# Patient Record
Sex: Male | Born: 1965 | Race: White | Hispanic: No | Marital: Single | Smoking: Current every day smoker
Health system: Southern US, Community
[De-identification: ages and names within clinical notes are randomized; demographics above are authoritative.]

## PROBLEM LIST (undated history)

## (undated) DIAGNOSIS — I10 Essential (primary) hypertension: Secondary | ICD-10-CM

## (undated) HISTORY — PX: CHOLECYSTECTOMY: SHX55

---

## 2016-06-11 ENCOUNTER — Encounter (HOSPITAL_COMMUNITY): Payer: Self-pay

## 2016-06-11 ENCOUNTER — Emergency Department (HOSPITAL_COMMUNITY): Payer: Self-pay

## 2016-06-11 ENCOUNTER — Emergency Department (HOSPITAL_COMMUNITY)
Admission: EM | Admit: 2016-06-11 | Discharge: 2016-06-11 | Disposition: A | Discharge status: Home / Self Care | Payer: Self-pay | Attending: Emergency Medicine | Admitting: Emergency Medicine

## 2016-06-11 DIAGNOSIS — Y939 Activity, unspecified: Secondary | ICD-10-CM | POA: Insufficient documentation

## 2016-06-11 DIAGNOSIS — Y990 Civilian activity done for income or pay: Secondary | ICD-10-CM | POA: Insufficient documentation

## 2016-06-11 DIAGNOSIS — I10 Essential (primary) hypertension: Secondary | ICD-10-CM | POA: Insufficient documentation

## 2016-06-11 DIAGNOSIS — F17210 Nicotine dependence, cigarettes, uncomplicated: Secondary | ICD-10-CM | POA: Insufficient documentation

## 2016-06-11 DIAGNOSIS — W208XXA Other cause of strike by thrown, projected or falling object, initial encounter: Secondary | ICD-10-CM | POA: Insufficient documentation

## 2016-06-11 DIAGNOSIS — Y929 Unspecified place or not applicable: Secondary | ICD-10-CM | POA: Insufficient documentation

## 2016-06-11 DIAGNOSIS — S9031XA Contusion of right foot, initial encounter: Secondary | ICD-10-CM | POA: Insufficient documentation

## 2016-06-11 HISTORY — DX: Essential (primary) hypertension: I10

## 2016-06-11 LAB — CBC WITH DIFFERENTIAL/PLATELET
BASOS ABS: 0 10*3/uL (ref 0.0–0.1)
Basophils Relative: 0 %
Eosinophils Absolute: 0.1 10*3/uL (ref 0.0–0.7)
Eosinophils Relative: 1 %
HEMATOCRIT: 47.4 % (ref 39.0–52.0)
HEMOGLOBIN: 15.8 g/dL (ref 13.0–17.0)
LYMPHS ABS: 1.8 10*3/uL (ref 0.7–4.0)
LYMPHS PCT: 27 %
MCH: 31.5 pg (ref 26.0–34.0)
MCHC: 33.3 g/dL (ref 30.0–36.0)
MCV: 94.6 fL (ref 78.0–100.0)
Monocytes Absolute: 0.9 10*3/uL (ref 0.1–1.0)
Monocytes Relative: 14 %
NEUTROS ABS: 4.1 10*3/uL (ref 1.7–7.7)
NEUTROS PCT: 58 %
PLATELETS: 229 10*3/uL (ref 150–400)
RBC: 5.01 MIL/uL (ref 4.22–5.81)
RDW: 13.1 % (ref 11.5–15.5)
WBC: 6.9 10*3/uL (ref 4.0–10.5)

## 2016-06-11 LAB — BASIC METABOLIC PANEL
ANION GAP: 7 (ref 5–15)
BUN: 15 mg/dL (ref 6–20)
CHLORIDE: 110 mmol/L (ref 101–111)
CO2: 27 mmol/L (ref 22–32)
Calcium: 9.5 mg/dL (ref 8.9–10.3)
Creatinine, Ser: 1.21 mg/dL (ref 0.61–1.24)
GFR calc Af Amer: >60 mL/min (ref >60)
GLUCOSE: 89 mg/dL (ref 65–99)
POTASSIUM: 3.5 mmol/L (ref 3.5–5.1)
Sodium: 144 mmol/L (ref 135–145)

## 2016-06-11 MED ORDER — HYDROCHLOROTHIAZIDE 25 MG PO TABS
25.0000 mg | ORAL_TABLET | Freq: Every day | ORAL | Status: DC
  Administered 2016-06-11: 25 mg via ORAL
  Filled 2016-06-11: qty 1

## 2016-06-11 MED ORDER — LISINOPRIL 10 MG PO TABS: 10.0000 mg | ORAL_TABLET | Freq: Every day | ORAL | 0 refills | Status: AC

## 2016-06-11 MED ORDER — LISINOPRIL 10 MG PO TABS
10.0000 mg | ORAL_TABLET | Freq: Once | ORAL | Status: AC
  Administered 2016-06-11: 10 mg via ORAL
  Filled 2016-06-11: qty 1

## 2016-06-11 MED ORDER — OXYCODONE-ACETAMINOPHEN 5-325 MG PO TABS
ORAL_TABLET | ORAL | Status: AC
  Filled 2016-06-11: qty 1

## 2016-06-11 MED ORDER — OXYCODONE-ACETAMINOPHEN 5-325 MG PO TABS
1.0000 | ORAL_TABLET | ORAL | Status: DC | PRN
Start: 2016-06-11 — End: 2016-06-11
  Administered 2016-06-11: 1 via ORAL

## 2016-06-11 MED ORDER — HYDROCHLOROTHIAZIDE 25 MG PO TABS: 25.0000 mg | ORAL_TABLET | Freq: Every day | ORAL | 0 refills | Status: AC

## 2016-06-11 MED ORDER — HYDROCODONE-ACETAMINOPHEN 5-325 MG PO TABS: 1.0000 | ORAL_TABLET | Freq: Four times a day (QID) | ORAL | 0 refills | Status: AC | PRN

## 2016-06-11 MED ORDER — HYDROMORPHONE HCL 1 MG/ML IJ SOLN
1.0000 mg | Freq: Once | INTRAMUSCULAR | Status: AC
  Administered 2016-06-11: 1 mg via INTRAVENOUS
  Filled 2016-06-11: qty 1

## 2016-06-11 NOTE — Discharge Instructions (Signed)
Take motrin for pain.   Take vicodin for severe pain. DO NOT drive with it.   Use crutches and postop shoe for next few days. Expect some foot swelling and pain. Apply ice on it.   See [facility] to recheck blood pressure in a week.   Take your blood pressure meds in a week. If you can't get an appointment, please check blood pressure in a pharmacy to make sure that it is better.   Return to ER if you have severe foot pain and swelling, headaches, vomiting, chest pain.

## 2016-06-11 NOTE — Progress Notes (Signed)
Orthopedic Tech Progress Note Patient Details:  Martin Burke June 09, 1966 [id number]  Ortho Devices Type of Ortho Device: Crutches, Postop shoe/boot Ortho Device/Splint Location: RLE Ortho Device/Splint Interventions: Ordered, Application   Jennye MoccasinHughes, Yarethzy Croak Craig 06/11/2016, 3:37 PM

## 2016-06-11 NOTE — ED Provider Notes (Signed)
[facility]EMERGENCY DEPT Provider Note   CSN: [id number] Arrival date & time: 06/11/16  1209     History   Chief Complaint Chief Complaint  Patient presents with  . Foot Injury    HPI Evern CoreKeith Goon is a 50 y.o. male hx of HTN with medication uncompliance here with foot injury. States that he was at his job and a heavy beam landed on his right foot. Has severe throbbing right foot pain afterwards. Denies any head injury. Denies any chest or abdominal pain. He was noted to be hypertensive in triage and has hx of HTN but uncompliant with meds for several months.   The history is provided by the patient.    Past Medical History:  Diagnosis Date  . Hypertension     There are no active problems to display for this patient.   Past Surgical History:  Procedure Laterality Date  . CHOLECYSTECTOMY         Home Medications    Prior to Admission medications   Not on File    Family History No family history on file.  Social History Social History  Substance Use Topics  . Smoking status: Current Every Day Smoker    Packs/day: 1.00    Types: Cigarettes  . Smokeless tobacco: Never Used  . Alcohol use Yes     Comment: occasionally      Allergies   Review of patient's allergies indicates no known allergies.   Review of Systems Review of Systems  Musculoskeletal:       R foot pain   All other systems reviewed and are negative.    Physical Exam Updated Vital Signs BP (!) 205/123 (BP Location: Left Arm)   Pulse (!) 57   Temp 97.5 F (36.4 C) (Oral)   Resp 13   Ht 5\' 9"  (1.753 m)   Wt 173 lb (78.5 kg)   SpO2 99%   BMI 25.55 kg/m   Physical Exam  Constitutional: He is oriented to person, place, and time.  Uncomfortable   HENT:  Head: Normocephalic.  Eyes: EOM are normal. Pupils are equal, round, and reactive to light.  Neck: Normal range of motion.  Cardiovascular: Normal rate, regular rhythm and normal heart sounds.   Pulmonary/Chest: Effort normal and  breath sounds normal.  Abdominal: Soft. Bowel sounds are normal.  Musculoskeletal:  R foot slightly swollen, 2+ dorsalis pedis pulse. Nl sensation. Able to wiggle toes with pain. No obvious deformity.   Neurological: He is alert and oriented to person, place, and time.  Skin: Skin is warm.  Psychiatric: He has a normal mood and affect.  Nursing note and vitals reviewed.    ED Treatments / Results  Labs (all labs ordered are listed, but only abnormal results are displayed) Labs Reviewed  CBC WITH DIFFERENTIAL/PLATELET  BASIC METABOLIC PANEL    EKG  EKG Interpretation None       Radiology Dg Foot Complete Left  Result Date: 06/11/2016 CLINICAL DATA:  Foot pain after object falling on foot. Initial encounter. EXAM: LEFT FOOT - COMPLETE 3+ VIEW COMPARISON:  None. FINDINGS: The mineralization and alignment are normal. There is no evidence of acute fracture or dislocation. The joint spaces are maintained aside from minimal degenerative changes at the first MTP joint. Type 1 accessory navicular and calcaneal spurring noted. No focal soft tissue swelling identified. IMPRESSION: No acute osseous findings. Electronically Signed   By: Carey BullocksWilliam  Veazey M.D.   On: 06/11/2016 13:29    Procedures Procedures (including critical care  time)  Medications Ordered in ED Medications  oxyCODONE-acetaminophen (PERCOCET/ROXICET) 5-325 MG per tablet 1 tablet (1 tablet Oral Given 06/11/16 1225)  hydrochlorothiazide (HYDRODIURIL) tablet 25 mg (25 mg Oral Given 06/11/16 1523)  HYDROmorphone (DILAUDID) injection 1 mg (1 mg Intravenous Given 06/11/16 1530)  lisinopril (PRINIVIL,ZESTRIL) tablet 10 mg (10 mg Oral Given 06/11/16 1523)     Initial Impression / Assessment and Plan / ED Course  I have reviewed the triage vital signs and the nursing notes.  Pertinent labs & imaging results that were available during my care of the patient were reviewed by me and considered in my medical decision making (see  chart for details).  Clinical Course    Evern CoreKeith Wool is a 50 y.o. male here with R foot injury. Hypertensive 230/120 but uncompliant with meds and asymptomatic. Will get foot xray, will get basic labs. Will give pain meds and BP meds and reassess.   5:02 PM Labs unremarkable. BP improved to 205/123 from 221/118. Still asymptomatic. xrays showed no fracture. Given crutches, postop shoe, pain meds. I reviewed [place] controlled substance database and he filled 10 tramadol on 01/13/16. Will give 10 vicodins for pain.    Final Clinical Impressions(s) / ED Diagnoses   Final diagnoses:  None    New Prescriptions New Prescriptions   No medications on file     Charlynne Panderavid Hsienta Bridey Brookover, MD 06/11/16 1704

## 2016-06-11 NOTE — ED Triage Notes (Signed)
Per pT, Pt is coming from work where he dropped a metal beam on his right foot. Pulses noted to be intact with good cap refill. Reports extreme pain and inability to walk due to pain.

## 2016-08-13 ENCOUNTER — Observation Stay (HOSPITAL_COMMUNITY): Payer: Self-pay | Admitting: Internal Medicine

## 2016-08-13 ENCOUNTER — Emergency Department (HOSPITAL_COMMUNITY): Payer: Self-pay

## 2016-08-13 ENCOUNTER — Emergency Department (HOSPITAL_COMMUNITY)
Admission: RE | Admit: 2016-08-13 | Discharge: 2016-08-13 | Disposition: A | Discharge status: Home / Self Care | Payer: Self-pay | Source: Ambulatory Visit

## 2016-08-13 ENCOUNTER — Encounter (HOSPITAL_COMMUNITY): Payer: Self-pay

## 2016-08-13 ENCOUNTER — Observation Stay
Admission: EM | Admit: 2016-08-13 | Discharge: 2016-08-14 | Disposition: A | Discharge status: Home / Self Care | Payer: Self-pay | Attending: Internal Medicine | Admitting: Internal Medicine

## 2016-08-13 ENCOUNTER — Emergency Department
Admission: EM | Admit: 2016-08-13 | Discharge: 2016-08-13 | Disposition: A | Discharge status: Home / Self Care | Payer: Self-pay | Attending: Emergency Medicine | Admitting: Emergency Medicine

## 2016-08-13 DIAGNOSIS — I517 Cardiomegaly: Secondary | ICD-10-CM

## 2016-08-13 DIAGNOSIS — R748 Abnormal levels of other serum enzymes: Secondary | ICD-10-CM

## 2016-08-13 DIAGNOSIS — R7989 Other specified abnormal findings of blood chemistry: Secondary | ICD-10-CM

## 2016-08-13 DIAGNOSIS — G8911 Acute pain due to trauma: Secondary | ICD-10-CM

## 2016-08-13 DIAGNOSIS — I160 Hypertensive urgency: Secondary | ICD-10-CM

## 2016-08-13 DIAGNOSIS — R9431 Abnormal electrocardiogram [ECG] [EKG]: Secondary | ICD-10-CM

## 2016-08-13 DIAGNOSIS — F17200 Nicotine dependence, unspecified, uncomplicated: Secondary | ICD-10-CM

## 2016-08-13 DIAGNOSIS — I161 Hypertensive emergency: Secondary | ICD-10-CM | POA: Diagnosis present

## 2016-08-13 DIAGNOSIS — R109 Unspecified abdominal pain: Secondary | ICD-10-CM | POA: Insufficient documentation

## 2016-08-13 DIAGNOSIS — S0990XA Unspecified injury of head, initial encounter: Secondary | ICD-10-CM | POA: Insufficient documentation

## 2016-08-13 DIAGNOSIS — R51 Headache: Secondary | ICD-10-CM

## 2016-08-13 HISTORY — DX: Essential (primary) hypertension: I10

## 2016-08-13 LAB — BASIC METABOLIC PANEL
ANION GAP: 6 mmol/L
ANION GAP: 8 mmol/L
BUN/CREA RATIO: 13
BUN/CREA RATIO: 12
BUN/CREA RATIO: 12
BUN: 15 mg/dL (ref 8–20)
BUN: 15 mg/dL (ref 8–20)
CALCIUM: 9.7 mg/dL (ref 8.9–10.3)
CALCIUM: 9.9 mg/dL (ref 8.9–10.3)
CHLORIDE: 101 mmol/L (ref 101–111)
CHLORIDE: 97 mmol/L — ABNORMAL LOW (ref 101–111)
CO2 TOTAL: 30 mmol/L (ref 22–32)
CO2 TOTAL: 30 mmol/L (ref 22–32)
CREATININE: 1.2 mg/dL (ref 0.6–1.2)
CREATININE: 1.24 mg/dL — ABNORMAL HIGH (ref 0.6–1.2)
ESTIMATED GFR: 64 mL/min/1.73mˆ2
ESTIMATED GFR: 61 mL/min/1.73mˆ2
GLUCOSE: 102 mg/dL (ref 70–110)
GLUCOSE: 86 mg/dL (ref 70–110)
POTASSIUM: 4.1 mmol/L (ref 3.6–5.1)
POTASSIUM: 3.7 mmol/L (ref 3.6–5.1)
POTASSIUM: 3.7 mmol/L (ref 3.6–5.1)
SODIUM: 137 mmol/L (ref 136–144)
SODIUM: 135 mmol/L — ABNORMAL LOW (ref 136–144)

## 2016-08-13 LAB — CBC WITH DIFF
BASOPHIL #: 0.1 x10ˆ3/uL (ref 0.00–0.20)
BASOPHIL #: 0.1 x10ˆ3/uL (ref 0.00–0.20)
BASOPHIL %: 1 %
BASOPHIL %: 1 %
EOSINOPHIL #: 0.2 x10ˆ3/uL (ref 0.00–0.50)
EOSINOPHIL #: 0.4 x10ˆ3/uL (ref 0.00–0.50)
EOSINOPHIL %: 2 %
EOSINOPHIL %: 3 %
HCT: 49.3 % (ref 38.9–50.5)
HCT: 48.8 % (ref 38.9–50.5)
HGB: 16.9 g/dL (ref 13.4–17.3)
HGB: 17.2 g/dL (ref 13.4–17.3)
LYMPHOCYTE #: 2.5 x10ˆ3/uL (ref 0.80–3.20)
LYMPHOCYTE #: 1.9 x10ˆ3/uL (ref 0.80–3.20)
LYMPHOCYTE %: 23 %
LYMPHOCYTE %: 16 %
MCH: 31.5 pg (ref 27.9–33.1)
MCH: 32.3 pg (ref 27.9–33.1)
MCHC: 34.3 g/dL (ref 32.8–36.0)
MCHC: 35.2 g/dL (ref 32.8–36.0)
MCV: 91.9 fL (ref 82.4–95.0)
MCV: 91.9 fL (ref 82.4–95.0)
MONOCYTE #: 1.1 x10ˆ3/uL — ABNORMAL HIGH (ref 0.20–0.80)
MONOCYTE #: 1.1 x10ˆ3/uL — ABNORMAL HIGH (ref 0.20–0.80)
MONOCYTE %: 10 %
MONOCYTE %: 10 %
MPV: 8.3 fL (ref 6.0–10.2)
MPV: 8.3 fL (ref 6.0–10.2)
NEUTROPHIL #: 6.9 x10ˆ3/uL — ABNORMAL HIGH (ref 1.60–5.50)
NEUTROPHIL #: 7.9 x10ˆ3/uL — ABNORMAL HIGH (ref 1.60–5.50)
NEUTROPHIL %: 64 %
NEUTROPHIL %: 69 %
PLATELETS: 216 x10ˆ3/uL (ref 140–440)
PLATELETS: 205 x10ˆ3/uL (ref 140–440)
RBC: 5.36 x10ˆ6/uL (ref 4.40–5.68)
RBC: 5.32 x10ˆ6/uL (ref 4.40–5.68)
RDW: 13.3 % (ref 10.9–15.1)
RDW: 13.3 % (ref 10.9–15.1)
WBC: 10.9 x10ˆ3/uL — ABNORMAL HIGH (ref 3.3–9.3)
WBC: 11.3 x10ˆ3/uL — ABNORMAL HIGH (ref 3.3–9.3)

## 2016-08-13 LAB — HEPATIC FUNCTION PANEL
ALBUMIN: 4.4 g/dL (ref 3.5–4.8)
ALBUMIN: 4.7 g/dL (ref 3.5–4.8)
ALKALINE PHOSPHATASE: 79 U/L (ref 20–130)
ALKALINE PHOSPHATASE: 94 U/L (ref 20–130)
ALT (SGPT): 38 U/L (ref 7–42)
ALT (SGPT): 48 U/L — ABNORMAL HIGH (ref 7–42)
AST (SGOT): 45 U/L — ABNORMAL HIGH (ref 12–35)
BILIRUBIN DIRECT: <0.2 mg/dL (ref 0.1–0.5)
BILIRUBIN DIRECT: 0.2 mg/dL (ref 0.1–0.5)
BILIRUBIN TOTAL: 0.7 mg/dL (ref 0.3–1.2)
PROTEIN TOTAL: 7.2 g/dL (ref 6.4–8.3)
PROTEIN TOTAL: 8 g/dL (ref 6.4–8.3)

## 2016-08-13 LAB — THYROID STIMULATING HORMONE (SENSITIVE TSH): TSH: 2.154 u[IU]/mL (ref 0.340–5.600)

## 2016-08-13 LAB — URINALYSIS, MACRO/MICRO
BILIRUBIN: NEGATIVE mg/dL
GLUCOSE: NEGATIVE mg/dL
KETONES: NEGATIVE mg/dL
LEUKOCYTES: NEGATIVE WBCs/uL
NITRITE: NEGATIVE
PH: 5 (ref 5.0–7.0)
PROTEIN: 100 mg/dL — AB
SPECIFIC GRAVITY: 1.022 (ref 1.010–1.025)
UROBILINOGEN: 0.2 mg/dL

## 2016-08-13 LAB — TROPONIN-I: TROPONIN I: 0.06 ng/mL (ref <=0.04)

## 2016-08-13 LAB — PT/INR: INR: 0.93 (ref 0.90–1.10)

## 2016-08-13 LAB — LIPASE: LIPASE: 72 U/L — ABNORMAL HIGH (ref 22–51)

## 2016-08-13 MED ORDER — ONDANSETRON HCL (PF) 4 MG/2 ML INJECTION SOLUTION
4.00 mg | INTRAMUSCULAR | Status: AC
Start: 2016-08-13 — End: 2016-08-13
  Administered 2016-08-13: 4 mg via INTRAVENOUS
  Filled 2016-08-13: qty 2

## 2016-08-13 MED ORDER — MORPHINE 4 MG/ML INTRAVENOUS CARTRIDGE
4.00 mg | CARTRIDGE | INTRAVENOUS | Status: AC
Start: 2016-08-13 — End: 2016-08-13
  Administered 2016-08-13: 4 mg via INTRAVENOUS
  Filled 2016-08-13: qty 1

## 2016-08-13 MED ORDER — IOVERSOL 320 MG IODINE/ML INTRAVENOUS SOLUTION
70.00 mL | INTRAVENOUS | Status: AC
Start: 2016-08-13 — End: 2016-08-13
  Administered 2016-08-13: 70 mL via INTRAVENOUS

## 2016-08-13 MED ORDER — SODIUM CHLORIDE 0.9 % INTRAVENOUS SOLUTION
INTRAVENOUS | Status: DC
Start: 2016-08-14 — End: 2016-08-14

## 2016-08-13 MED ORDER — NICOTINE 21 MG/24 HR DAILY TRANSDERMAL PATCH
21.0000 mg | MEDICATED_PATCH | Freq: Every day | TRANSDERMAL | Status: DC
Start: 2016-08-14 — End: 2016-08-14
  Administered 2016-08-14: 0 mg via TRANSDERMAL
  Filled 2016-08-13 (×2): qty 1

## 2016-08-13 MED ORDER — HYDRALAZINE 20 MG/ML INJECTION SOLUTION
5.00 mg | INTRAMUSCULAR | Status: AC
Start: 2016-08-13 — End: 2016-08-13
  Administered 2016-08-13 (×2): 5 mg via INTRAVENOUS
  Filled 2016-08-13: qty 1

## 2016-08-13 MED ORDER — ENALAPRIL MALEATE 5 MG TABLET
5.0000 mg | ORAL_TABLET | Freq: Once | ORAL | Status: AC
Start: 2016-08-14 — End: 2016-08-13
  Filled 2016-08-13: qty 1

## 2016-08-13 MED ORDER — HYDROMORPHONE 2 MG/ML INJECTION SYRINGE
0.5000 mg | INJECTION | INTRAMUSCULAR | Status: AC
Start: 2016-08-13 — End: 2016-08-13
  Administered 2016-08-13: 0.5 mg via INTRAVENOUS
  Filled 2016-08-13: qty 1

## 2016-08-13 MED ORDER — CYCLOBENZAPRINE 5 MG TABLET
5.00 mg | ORAL_TABLET | Freq: Three times a day (TID) | ORAL | 0 refills | Status: AC
Start: 2016-08-13 — End: ?

## 2016-08-13 MED ORDER — MORPHINE 4 MG/ML INTRAVENOUS CARTRIDGE
4.0000 mg | CARTRIDGE | INTRAVENOUS | Status: AC
Start: 2016-08-13 — End: 2016-08-13
  Administered 2016-08-13: 4 mg via INTRAVENOUS
  Filled 2016-08-13: qty 1

## 2016-08-13 MED ORDER — SODIUM CHLORIDE 0.9 % INJECTION SOLUTION
10.00 mL | INTRAMUSCULAR | Status: DC
Start: 2016-08-13 — End: 2016-08-14

## 2016-08-13 MED ORDER — SODIUM CHLORIDE 0.9 % (FLUSH) INJECTION SYRINGE
3.0000 mL | INJECTION | INTRAMUSCULAR | Status: DC | PRN
Start: 2016-08-13 — End: 2016-08-14

## 2016-08-13 MED ORDER — SODIUM CHLORIDE 0.9 % (FLUSH) INJECTION SYRINGE
3.0000 mL | INJECTION | Freq: Three times a day (TID) | INTRAMUSCULAR | Status: DC
Start: 2016-08-13 — End: 2016-08-14
  Administered 2016-08-13 – 2016-08-14 (×2): 3 mL
  Administered 2016-08-14: 0 mL

## 2016-08-13 MED ORDER — PROCHLORPERAZINE EDISYLATE 10 MG/2 ML (5 MG/ML) INJECTION SOLUTION
10.00 mg | INTRAMUSCULAR | Status: AC
Start: 2016-08-13 — End: 2016-08-13
  Administered 2016-08-13: 10 mg via INTRAVENOUS
  Filled 2016-08-13: qty 2

## 2016-08-13 MED ORDER — DIPHENHYDRAMINE 50 MG/ML INJECTION SOLUTION
50.00 mg | INTRAMUSCULAR | Status: AC
Start: 2016-08-13 — End: 2016-08-13
  Administered 2016-08-13: 50 mg via INTRAVENOUS
  Filled 2016-08-13: qty 1

## 2016-08-13 MED ORDER — CLONIDINE HCL 0.2 MG TABLET
0.20 mg | ORAL_TABLET | Freq: Two times a day (BID) | ORAL | Status: DC
Start: 2016-08-13 — End: 2016-08-14
  Administered 2016-08-13 – 2016-08-14 (×2): 0.2 mg via ORAL
  Filled 2016-08-13 (×5): qty 1

## 2016-08-13 MED ORDER — HYDROCHLOROTHIAZIDE 25 MG TABLET
25.0000 mg | ORAL_TABLET | Freq: Every day | ORAL | Status: DC
Start: 2016-08-14 — End: 2016-08-14
  Filled 2016-08-13 (×2): qty 1

## 2016-08-13 MED ORDER — ENALAPRIL MALEATE 5 MG TABLET
5.0000 mg | ORAL_TABLET | Freq: Every day | ORAL | Status: DC
Start: 2016-08-14 — End: 2016-08-14
  Administered 2016-08-14: 5 mg via ORAL
  Filled 2016-08-13 (×2): qty 1

## 2016-08-13 MED ORDER — LABETALOL 5 MG/ML INTRAVENOUS SOLUTION
10.00 mg | INTRAVENOUS | Status: DC | PRN
Start: 2016-08-13 — End: 2016-08-14
  Filled 2016-08-13: qty 2

## 2016-08-13 MED ORDER — METOPROLOL TARTRATE 5 MG/5 ML INTRAVENOUS SOLUTION
5.00 mg | INTRAVENOUS | Status: AC
Start: 2016-08-13 — End: 2016-08-13
  Administered 2016-08-13: 5 mg via INTRAVENOUS
  Filled 2016-08-13: qty 5

## 2016-08-13 MED ORDER — SODIUM CHLORIDE 0.9 % INJECTION SOLUTION
10.00 mL | INTRAMUSCULAR | Status: DC
Start: 2016-08-13 — End: 2016-08-13

## 2016-08-13 MED ORDER — ENALAPRIL MALEATE 5 MG TABLET
5.0000 mg | ORAL_TABLET | Freq: Every day | ORAL | Status: DC
Start: 2016-08-13 — End: 2016-08-13

## 2016-08-13 MED ORDER — IOVERSOL 320 MG IODINE/ML INTRAVENOUS SOLUTION
120.00 mL | INTRAVENOUS | Status: AC
Start: 2016-08-13 — End: 2016-08-13
  Administered 2016-08-13: 120 mL via INTRAVENOUS

## 2016-08-13 MED ORDER — NICOTINE (POLACRILEX) 2 MG GUM
4.00 mg | CHEWING_GUM | BUCCAL | Status: DC | PRN
Start: 2016-08-13 — End: 2016-08-14
  Filled 2016-08-13: qty 2

## 2016-08-13 MED ORDER — SODIUM CHLORIDE 0.9 % IV BOLUS
1000.00 mL | INJECTION | Status: AC
Start: 2016-08-13 — End: 2016-08-13
  Administered 2016-08-13: 1000 mL via INTRAVENOUS

## 2016-08-13 MED ADMIN — sodium chloride 0.9 % (flush) injection syringe: INTRAVENOUS | @ 19:00:00

## 2016-08-13 MED ADMIN — famotidine 20 mg tablet: INTRAVENOUS | @ 20:00:00

## 2016-08-13 MED ADMIN — lactated Ringers intravenous solution: INTRAVENOUS | @ 05:00:00 | NDC 00264775000
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
## 2016-08-13 NOTE — ED Triage Notes (Signed)
Patient presents to the ED with back and hip pain. Patient was seen last night and has been here all day trying to find a way back to [place].
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
## 2016-08-13 NOTE — Discharge Instructions (Signed)
Return for any new change or worsening symptoms otherwise contact your doctor in [place] for a follow up.
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
## 2016-08-14 ENCOUNTER — Observation Stay (HOSPITAL_COMMUNITY): Payer: Self-pay

## 2016-08-14 ENCOUNTER — Observation Stay (HOSPITAL_COMMUNITY)
Admission: RE | Admit: 2016-08-14 | Discharge: 2016-08-14 | Disposition: A | Discharge status: Home / Self Care | Payer: Self-pay | Source: Ambulatory Visit

## 2016-08-14 LAB — TROPONIN-I
TROPONIN I: 0.07 ng/mL (ref <=0.04)
TROPONIN I: 0.05 ng/mL (ref <=0.04)

## 2016-08-14 LAB — BASIC METABOLIC PANEL
ANION GAP: 5 mmol/L
ANION GAP: 5 mmol/L
BUN/CREA RATIO: 11
BUN: 16 mg/dL (ref 8–20)
CALCIUM: 8.8 mg/dL — ABNORMAL LOW (ref 8.9–10.3)
CHLORIDE: 106 mmol/L (ref 101–111)
CO2 TOTAL: 27 mmol/L (ref 22–32)
CREATININE: 1.4 mg/dL — ABNORMAL HIGH (ref 0.6–1.2)
ESTIMATED GFR: 53 mL/min/{1.73_m2}
GLUCOSE: 96 mg/dL (ref 70–110)
POTASSIUM: 4.1 mmol/L (ref 3.6–5.1)
SODIUM: 138 mmol/L (ref 136–144)

## 2016-08-14 LAB — ECG 12 LEAD - ED USE
Calculated P Axis: 59 deg
Calculated P Axis: 44 deg
Calculated T Axis: 106 deg
EKG Severity: ABNORMAL
EKG Severity: ABNORMAL
Heart Rate: 67 {beats}/min
Heart Rate: 82 {beats}/min
I 40 Axis: 9 deg
PR Interval: 177 ms
PR Interval: 176 ms
QRS Axis: -2 deg
QRS Axis: -12 deg
QRS Duration: 97 ms
QRS Duration: 92 ms
QT Interval: 426 ms
QTC Calculation: 498 ms
ST Axis: 215 deg
T 40 Axis: -7 deg

## 2016-08-14 LAB — CBC WITH DIFF
BASOPHIL #: 0.1 x10ˆ3/uL (ref 0.00–0.20)
BASOPHIL %: 1 %
EOSINOPHIL #: 0.3 x10ˆ3/uL (ref 0.00–0.50)
EOSINOPHIL %: 4 %
HCT: 43.1 % (ref 38.9–50.5)
HGB: 14.5 g/dL (ref 13.4–17.3)
LYMPHOCYTE #: 2.2 x10ˆ3/uL (ref 0.80–3.20)
LYMPHOCYTE %: 34 %
MCH: 31.2 pg (ref 27.9–33.1)
MCHC: 33.7 g/dL (ref 32.8–36.0)
MCV: 92.4 fL (ref 82.4–95.0)
MONOCYTE #: 0.9 x10ˆ3/uL — ABNORMAL HIGH (ref 0.20–0.80)
MONOCYTE %: 14 %
MPV: 8.3 fL (ref 6.0–10.2)
NEUTROPHIL #: 3.1 x10ˆ3/uL (ref 1.60–5.50)
NEUTROPHIL %: 48 %
PLATELETS: 177 x10ˆ3/uL (ref 140–440)
RBC: 4.67 x10ˆ6/uL (ref 4.40–5.68)
RDW: 13.2 % (ref 10.9–15.1)
WBC: 6.5 x10ˆ3/uL (ref 3.3–9.3)

## 2016-08-14 LAB — PHOSPHORUS: PHOSPHORUS: 4.5 mg/dL (ref 2.4–4.7)

## 2016-08-14 LAB — MAGNESIUM: MAGNESIUM: 2.3 mg/dL (ref 1.8–2.5)

## 2016-08-14 MED ORDER — HYDROCHLOROTHIAZIDE 25 MG TABLET
25.0000 mg | ORAL_TABLET | Freq: Every day | ORAL | 0 refills | Status: AC
Start: 2016-08-14 — End: ?

## 2016-08-14 MED ORDER — ENALAPRIL MALEATE 5 MG TABLET
5.0000 mg | ORAL_TABLET | Freq: Every day | ORAL | 0 refills | Status: AC
Start: 2016-08-14 — End: ?

## 2016-08-14 MED ORDER — REGADENOSON 0.4 MG/5 ML INTRAVENOUS SYRINGE
0.40 mg | INJECTION | INTRAVENOUS | Status: AC
Start: 2016-08-14 — End: 2016-08-14
  Administered 2016-08-14: 0.4 mg via INTRAVENOUS

## 2016-08-14 MED ORDER — TRAMADOL 50 MG TABLET
1.00 | ORAL_TABLET | Freq: Four times a day (QID) | ORAL | 0 refills | Status: AC | PRN
Start: 2016-08-14 — End: ?

## 2016-08-14 MED ORDER — CLONIDINE HCL 0.2 MG TABLET
0.20 mg | ORAL_TABLET | Freq: Two times a day (BID) | ORAL | 0 refills | Status: AC
Start: 2016-08-14 — End: ?

## 2016-08-14 NOTE — Nurses Notes (Signed)
Patient discharged home with family.  AVS reviewed with patient/care giver.  A written copy of the AVS and discharge instructions was given to the patient/care giver.  Questions sufficiently answered as needed.  Patient/care giver encouraged to follow up with PCP as indicated.  In the event of an emergency, patient/care giver instructed to call 911 or go to the nearest emergency room.

## 2016-08-14 NOTE — Care Plan (Signed)
Problem: Patient Care Overview (Adult,OB)  Goal: Plan of Care Review(Adult,OB)  The patient and/or their representative will communicate an understanding of their plan of care   Outcome: Ongoing (see interventions/notes)  Patient has been Loletha Grayer most of the night.  Patient has been absence of falls.  BP 130/77   Pulse 55   Temp 36.7 C (98.1 F)   Resp 16   Ht 1.753 m (5' 9.02")   Wt 78.5 kg (173 lb)   SpO2 99%   BMI 25.54 kg/m2  Lynnea Maizes, RN  08/14/2016, 05:15      Goal: Individualization/Patient Specific Goal(Adult/OB)  Outcome: Ongoing (see interventions/notes)  Goal: Interdisciplinary Rounds/Family Conf  Outcome: Ongoing (see interventions/notes)    Problem: Arrhythmia/Dysrhythmia (Symptomatic) (Adult)  Prevent and manage potential problems including: 1. electrophysiological conduction defect 2. syncope 3. hypoxia/hypoxemia 4. chest pain (angina) 5. embolism 6. situational response   Goal: Signs and Symptoms of Listed Potential Problems Will be Absent, Minimized or Managed (Arrhythmia/Dysrhythmia)  Signs and symptoms of listed potential problems will be absent, minimized or managed by discharge/transition of care (reference Arrhythmia/Dysrhythmia (Symptomatic) (Adult) CPG).   Outcome: Ongoing (see interventions/notes)    Problem: Cardiac Output Decreased (Adult)  Goal: Identify Related Risk Factors and Signs and Symptoms  Related risk factors and signs and symptoms are identified upon initiation of Human Response Clinical Practice Guideline (CPG).   Outcome: Completed Date Met:  08/14/16  Goal: Effective Tissue Perfusion  Patient will demonstrate the desired outcomes by discharge/transition of care.   Outcome: Ongoing (see interventions/notes)    Problem: Cardiac: ACS (Acute Coronary Syndrome) (Adult)  Prevent and manage potential problems including: 1. cardiovascular structural defects 2. chest pain (angina) 3. dysrhythmia/arrhythmia 4. embolism 5. heart failure/shock 6. ischemia leading to infarction 7.  pericarditis 8. situational response   Goal: Signs and Symptoms of Listed Potential Problems Will be Absent, Minimized or Managed (Cardiac: ACS)  Signs and symptoms of listed potential problems will be absent, minimized or managed by discharge/transition of care (reference Cardiac: ACS (Acute Coronary Syndrome) (Adult) CPG).   Outcome: Ongoing (see interventions/notes)    Problem: Fall Risk (Adult)  Goal: Identify Related Risk Factors and Signs and Symptoms  Related risk factors and signs and symptoms are identified upon initiation of Human Response Clinical Practice Guideline (CPG).   Outcome: Completed Date Met:  08/14/16  Goal: Absence of Falls  Patient will demonstrate the desired outcomes by discharge/transition of care.   Outcome: Ongoing (see interventions/notes)
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
## 2016-08-15 LAB — ECG 12 LEAD - ED USE
Calculated P Axis: 35 deg
Calculated T Axis: 172 deg
EKG Severity: ABNORMAL
Heart Rate: 62 {beats}/min
I 40 Axis: 19 deg
PR Interval: 186 ms
QRS Axis: -11 deg
QRS Duration: 95 ms
QRS Duration: 95 ms
QT Interval: 422 ms
QTC Calculation: 429 ms
ST Axis: 204 deg
ST Axis: 204 deg
T 40 Axis: -15 deg

## 2016-08-16 LAB — URINE CULTURE,ROUTINE: URINE CULTURE: NO GROWTH

## 2017-07-24 IMAGING — DX DG FOOT COMPLETE 3+V*L*
3 series · 3 of 3 positions shown · non-contrast
Comparison: None.

CLINICAL DATA: Foot pain after object falling on foot. Initial
encounter.

EXAM:
LEFT FOOT - COMPLETE 3+ VIEW

[foot ap]
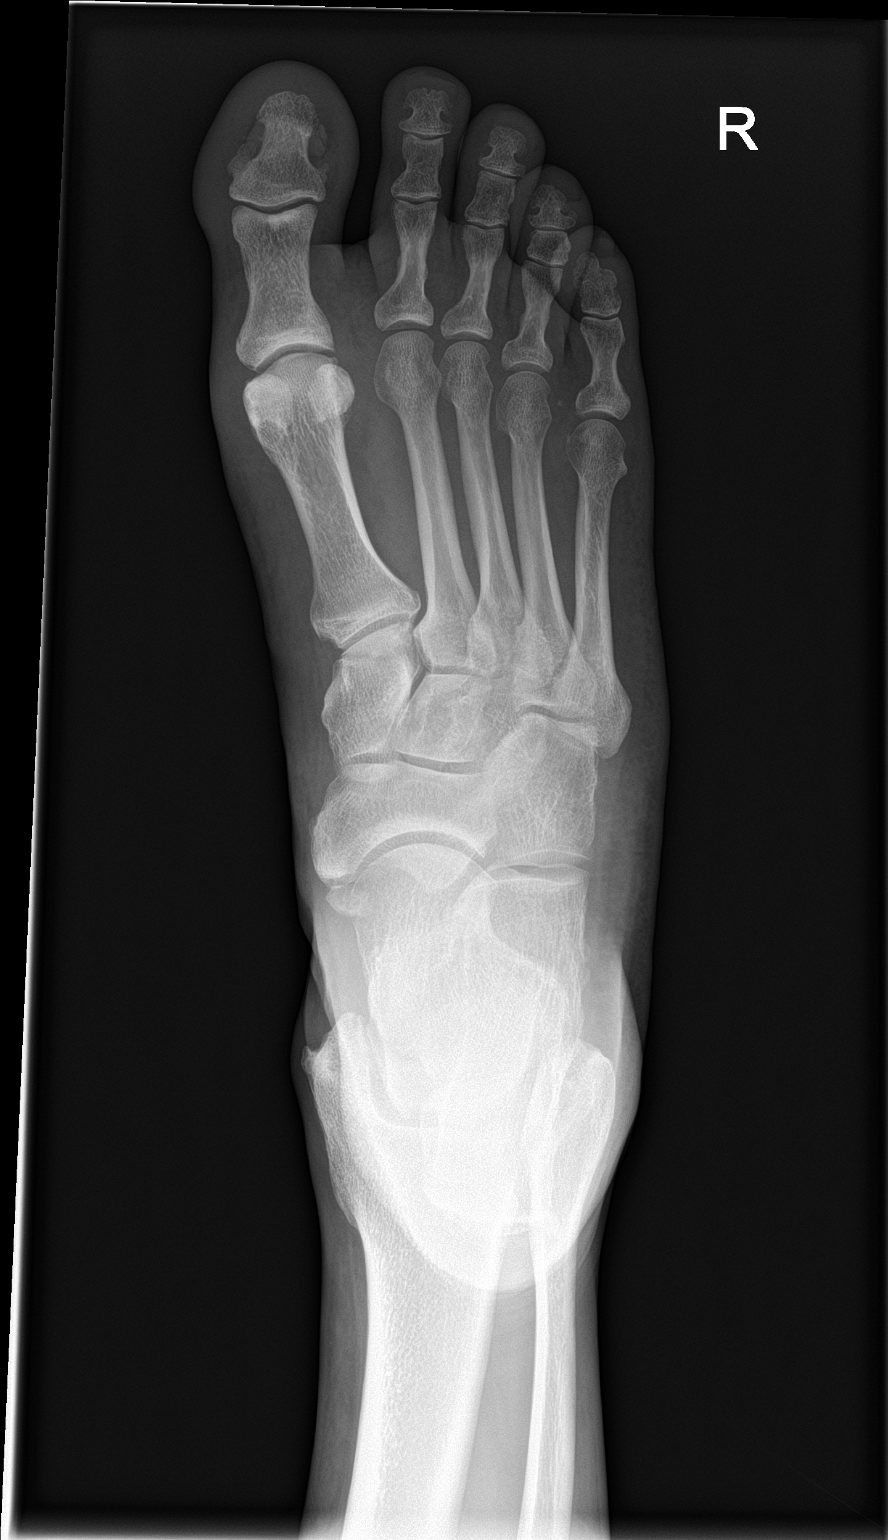

[foot obl]
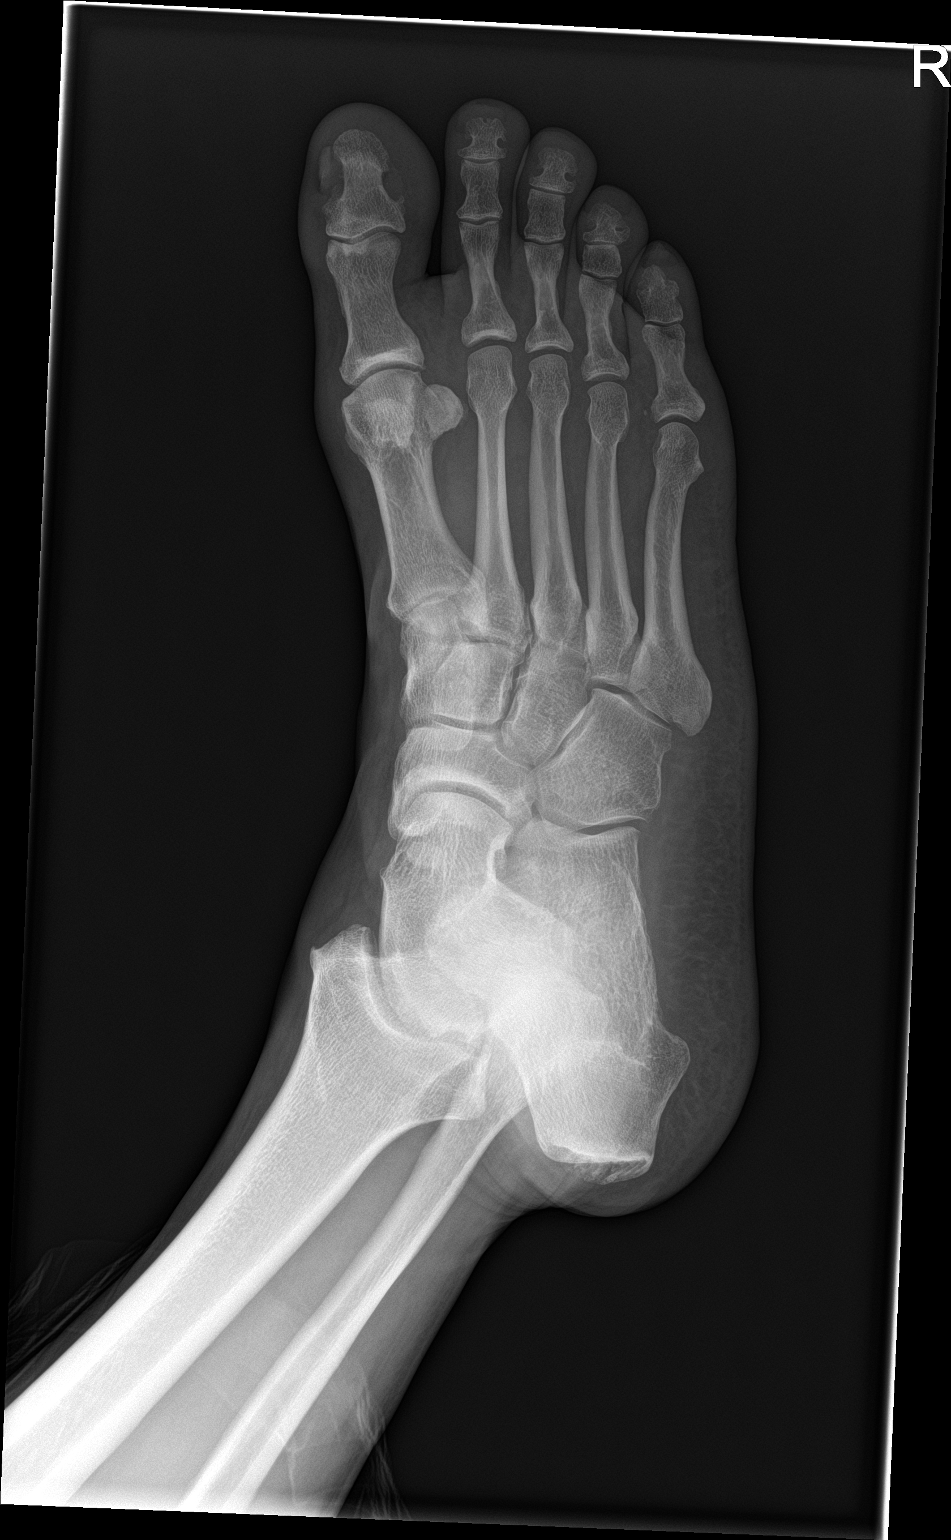

[foot lat]
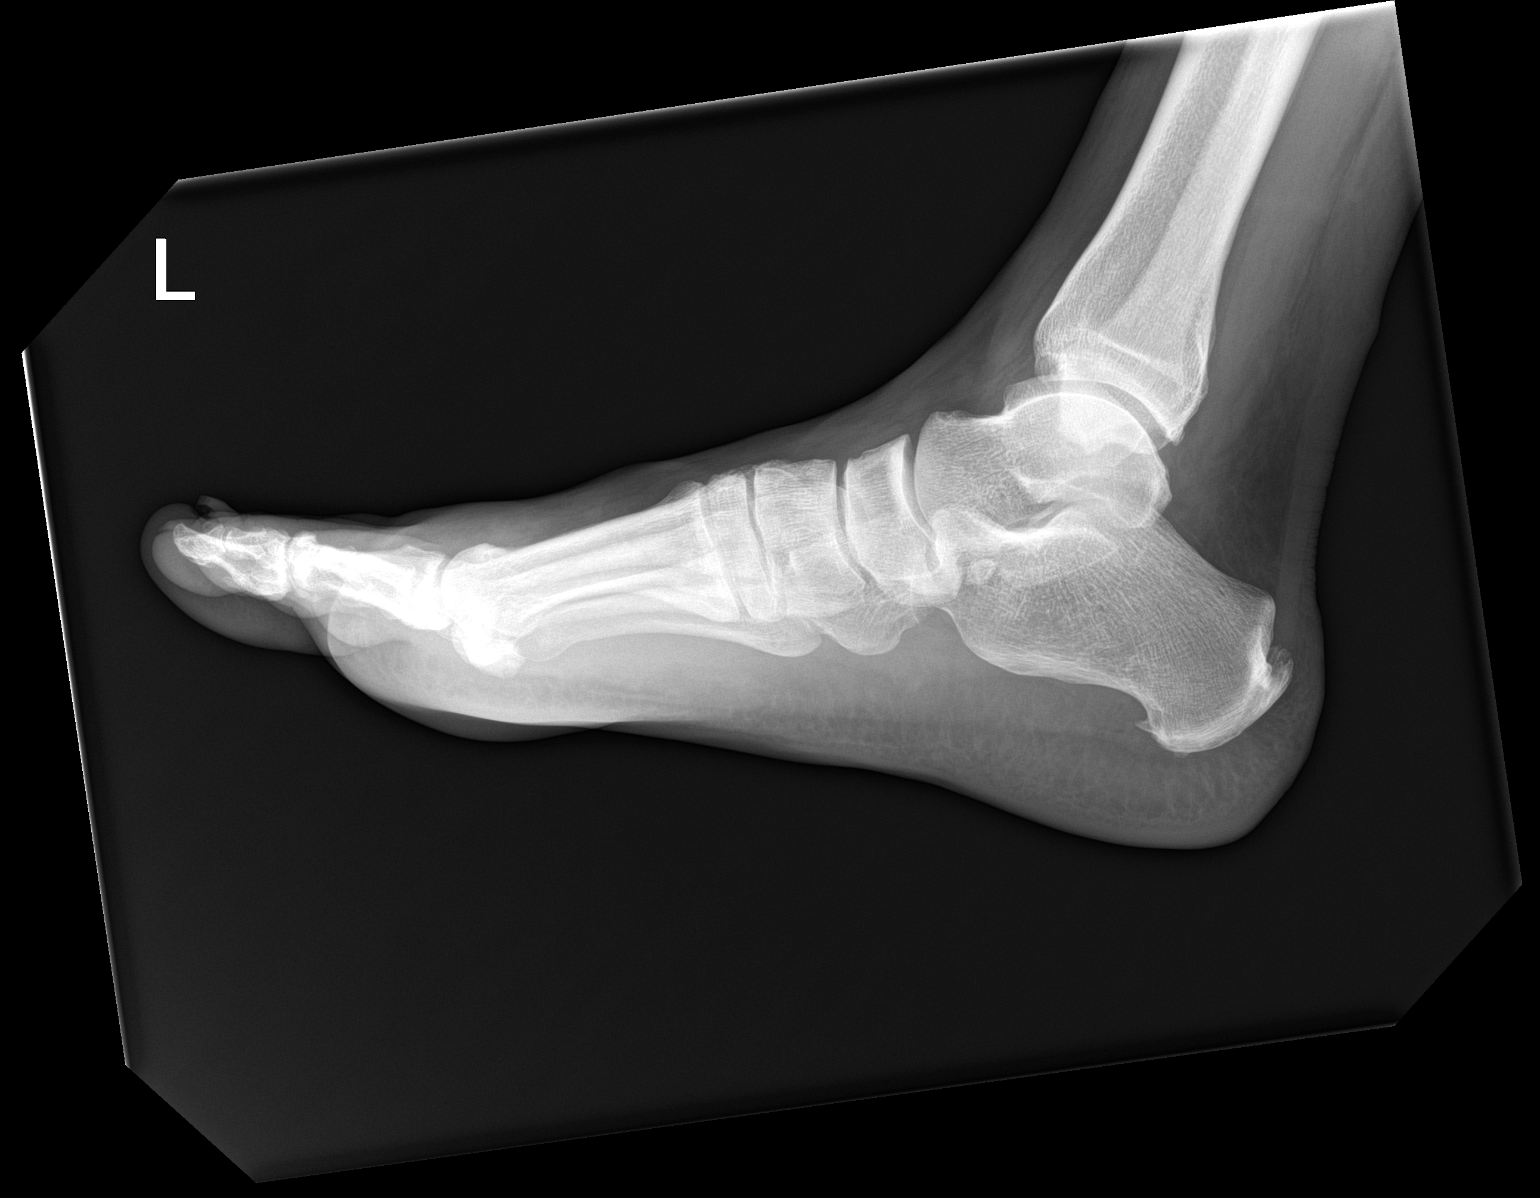

[3 of 3 positions shown; findings below may reference images not displayed]

FINDINGS: The mineralization and alignment are normal. There is no evidence of
acute fracture or dislocation. The joint spaces are maintained aside
from minimal degenerative changes at the first MTP joint. Type 1
accessory navicular and calcaneal spurring noted. No focal soft
tissue swelling identified.
IMPRESSION: No acute osseous findings.

## 2019-11-21 DEATH — deceased
